# Patient Record
Sex: Female | Born: 1970 | Race: White | Hispanic: No | Marital: Single | Smoking: Never smoker
Health system: Southern US, Community
[De-identification: ages and names within clinical notes are randomized; demographics above are authoritative.]

## PROBLEM LIST (undated history)

## (undated) DIAGNOSIS — A6000 Herpesviral infection of urogenital system, unspecified: Secondary | ICD-10-CM

## (undated) DIAGNOSIS — E041 Nontoxic single thyroid nodule: Secondary | ICD-10-CM

## (undated) DIAGNOSIS — R7303 Prediabetes: Secondary | ICD-10-CM

## (undated) DIAGNOSIS — E785 Hyperlipidemia, unspecified: Secondary | ICD-10-CM

## (undated) HISTORY — DX: Nontoxic single thyroid nodule: E04.1

## (undated) HISTORY — DX: Prediabetes: R73.03

## (undated) HISTORY — DX: Hyperlipidemia, unspecified: E78.5

## (undated) HISTORY — PX: MEDIAL COLLATERAL LIGAMENT REPAIR, KNEE: SHX2019

## (undated) HISTORY — DX: Herpesviral infection of urogenital system, unspecified: A60.00

## (undated) HISTORY — PX: ANTERIOR CRUCIATE LIGAMENT REPAIR: SHX115

---

## 1997-11-27 ENCOUNTER — Other Ambulatory Visit: Admission: RE | Admit: 1997-11-27 | Discharge: 1997-11-27 | Payer: Self-pay | Admitting: Obstetrics and Gynecology

## 1998-01-28 ENCOUNTER — Encounter: Admission: RE | Admit: 1998-01-28 | Discharge: 1998-04-28 | Payer: Self-pay | Admitting: Gynecology

## 1998-02-24 ENCOUNTER — Other Ambulatory Visit: Admission: RE | Admit: 1998-02-24 | Discharge: 1998-02-24 | Payer: Self-pay | Admitting: Obstetrics and Gynecology

## 1998-04-29 ENCOUNTER — Encounter: Admission: RE | Admit: 1998-04-29 | Discharge: 1998-07-28 | Payer: Self-pay | Admitting: Gynecology

## 1998-06-03 ENCOUNTER — Other Ambulatory Visit: Admission: RE | Admit: 1998-06-03 | Discharge: 1998-06-03 | Payer: Self-pay | Admitting: Obstetrics and Gynecology

## 1998-06-24 ENCOUNTER — Other Ambulatory Visit: Admission: RE | Admit: 1998-06-24 | Discharge: 1998-06-24 | Payer: Self-pay | Admitting: Obstetrics and Gynecology

## 1998-12-07 ENCOUNTER — Other Ambulatory Visit: Admission: RE | Admit: 1998-12-07 | Discharge: 1998-12-07 | Payer: Self-pay | Admitting: Obstetrics and Gynecology

## 1999-01-06 ENCOUNTER — Other Ambulatory Visit: Admission: RE | Admit: 1999-01-06 | Discharge: 1999-01-06 | Payer: Self-pay | Admitting: Obstetrics and Gynecology

## 2000-01-04 ENCOUNTER — Other Ambulatory Visit: Admission: RE | Admit: 2000-01-04 | Discharge: 2000-01-04 | Payer: Self-pay | Admitting: Obstetrics and Gynecology

## 2001-02-20 ENCOUNTER — Other Ambulatory Visit: Admission: RE | Admit: 2001-02-20 | Discharge: 2001-02-20 | Payer: Self-pay | Admitting: Obstetrics and Gynecology

## 2005-05-10 ENCOUNTER — Other Ambulatory Visit: Admission: RE | Admit: 2005-05-10 | Discharge: 2005-05-10 | Payer: Self-pay | Admitting: Obstetrics and Gynecology

## 2005-08-22 ENCOUNTER — Other Ambulatory Visit: Admission: RE | Admit: 2005-08-22 | Discharge: 2005-08-22 | Payer: Self-pay | Admitting: Obstetrics and Gynecology

## 2015-12-07 LAB — HM HIV SCREENING LAB: HM HIV Screening: NEGATIVE

## 2016-01-18 DIAGNOSIS — E782 Mixed hyperlipidemia: Secondary | ICD-10-CM | POA: Insufficient documentation

## 2016-02-29 DIAGNOSIS — R7303 Prediabetes: Secondary | ICD-10-CM | POA: Insufficient documentation

## 2016-04-25 HISTORY — PX: THYROIDECTOMY: SHX17

## 2016-08-01 DIAGNOSIS — C73 Malignant neoplasm of thyroid gland: Secondary | ICD-10-CM | POA: Insufficient documentation

## 2016-10-07 DIAGNOSIS — E890 Postprocedural hypothyroidism: Secondary | ICD-10-CM | POA: Insufficient documentation

## 2016-10-19 ENCOUNTER — Other Ambulatory Visit (HOSPITAL_COMMUNITY): Payer: Self-pay | Admitting: Internal Medicine

## 2016-10-19 DIAGNOSIS — C73 Malignant neoplasm of thyroid gland: Secondary | ICD-10-CM

## 2016-11-01 ENCOUNTER — Encounter (HOSPITAL_COMMUNITY)
Admission: RE | Admit: 2016-11-01 | Discharge: 2016-11-01 | Disposition: A | Discharge status: Home / Self Care | Payer: 59 | Source: Ambulatory Visit | Attending: Internal Medicine | Admitting: Internal Medicine

## 2016-11-01 DIAGNOSIS — C73 Malignant neoplasm of thyroid gland: Secondary | ICD-10-CM | POA: Diagnosis not present

## 2016-11-01 MED ORDER — STERILE WATER FOR INJECTION IJ SOLN
INTRAMUSCULAR | Status: AC
  Filled 2016-11-01: qty 10

## 2016-11-01 MED ORDER — THYROTROPIN ALFA 1.1 MG IM SOLR
0.9000 mg | INTRAMUSCULAR | Status: AC
  Administered 2016-11-01: 0.9 mg via INTRAMUSCULAR

## 2016-11-02 ENCOUNTER — Encounter (HOSPITAL_COMMUNITY)
Admission: RE | Admit: 2016-11-02 | Discharge: 2016-11-02 | Disposition: A | Discharge status: Home / Self Care | Payer: 59 | Source: Ambulatory Visit | Attending: Internal Medicine | Admitting: Internal Medicine

## 2016-11-02 DIAGNOSIS — C73 Malignant neoplasm of thyroid gland: Secondary | ICD-10-CM | POA: Diagnosis not present

## 2016-11-02 MED ORDER — THYROTROPIN ALFA 1.1 MG IM SOLR
0.9000 mg | INTRAMUSCULAR | Status: AC
  Administered 2016-11-02: 0.9 mg via INTRAMUSCULAR

## 2016-11-02 MED ORDER — STERILE WATER FOR INJECTION IJ SOLN
INTRAMUSCULAR | Status: AC
  Filled 2016-11-02: qty 10

## 2016-11-02 NOTE — Discharge Instructions (Addendum)
Thyrotropin Alfa injection What is this medicine? THYROTROPIN ALFA (thahy ruh TROH pin AL fa) is a man-made protein. It is used to diagnose any remaining thyroid cancer after treatment. It is also used to help treat thyroid cancer. This medicine may be used for other purposes; ask your health care provider or pharmacist if you have questions. COMMON BRAND NAME(S): Thyrogen What should I tell my health care provider before I take this medicine? They need to know if you have any of these conditions: -cancer that has spread to other parts of the body -have thyroid tissue remaining -heart disease -kidney disease -smoke tobacco -an unusual or allergic reaction to thyrotropin, thyroid products, other hormones, medicines, foods, or preservatives -pregnant or trying to get pregnant -breast-feeding How should I use this medicine? This medicine is for injection into a muscle. It is given by a health care professional in a hospital or clinic setting. Talk to your pediatrician regarding the use of this medicine in children. Special care may be needed. Overdosage: If you think you have taken too much of this medicine contact a poison control center or emergency room at once. NOTE: This medicine is only for you. Do not share this medicine with others. What if I miss a dose? It is important not to miss your dose. Call your doctor or health care professional if you are unable to keep an appointment. What may interact with this medicine? Interactions are not expected. This list may not describe all possible interactions. Give your health care provider a list of all the medicines, herbs, non-prescription drugs, or dietary supplements you use. Also tell them if you smoke, drink alcohol, or use illegal drugs. Some items may interact with your medicine. What should I watch for while using this medicine? Visit your doctor or health care professional regularly. Your condition will be monitored carefully while you  are receiving this medicine. You may need blood work done while you are taking this medicine. What side effects may I notice from receiving this medicine? Side effects that you should report to your doctor or health care professional as soon as possible: -allergic reactions like skin rash, itching or hives, swelling of the face, lips, or tongue -breathing problems -chest pain -signs and symptoms of a stroke like changes in vision; confusion; trouble speaking or understanding; severe headaches; sudden numbness or weakness of the face, arm or leg; trouble walking; dizziness; loss of balance or coordination Side effects that usually do not require medical attention (report to your doctor or health care professional if they continue or are bothersome): -dizziness -flu-like symptoms -headache -nausea, vomiting -weak or tired This list may not describe all possible side effects. Call your doctor for medical advice about side effects. You may report side effects to FDA at 1-800-FDA-1088. Where should I keep my medicine? This drug is given in a hospital or clinic and will not be stored at home. NOTE: This sheet is a summary. It may not cover all possible information. If you have questions about this medicine, talk to your doctor, pharmacist, or health care provider.  2018 Elsevier/Gold Standard (2014-09-05 15:04:47)

## 2016-11-03 ENCOUNTER — Encounter (HOSPITAL_COMMUNITY): Payer: Self-pay | Admitting: Radiology

## 2016-11-03 ENCOUNTER — Encounter (HOSPITAL_COMMUNITY)
Admission: RE | Admit: 2016-11-03 | Discharge: 2016-11-03 | Disposition: A | Discharge status: Home / Self Care | Payer: 59 | Source: Ambulatory Visit | Attending: Internal Medicine | Admitting: Internal Medicine

## 2016-11-03 DIAGNOSIS — C73 Malignant neoplasm of thyroid gland: Secondary | ICD-10-CM | POA: Diagnosis not present

## 2016-11-03 LAB — HCG, SERUM, QUALITATIVE: PREG SERUM: NEGATIVE

## 2016-11-03 MED ORDER — SODIUM IODIDE I 131 CAPSULE
60.7000 | Freq: Once | INTRAVENOUS | Status: AC | PRN
  Administered 2016-11-03: 60.7 via ORAL

## 2016-11-14 ENCOUNTER — Encounter (HOSPITAL_COMMUNITY)
Admission: RE | Admit: 2016-11-14 | Discharge: 2016-11-14 | Disposition: A | Discharge status: Home / Self Care | Payer: 59 | Source: Ambulatory Visit | Attending: Internal Medicine | Admitting: Internal Medicine

## 2016-11-14 DIAGNOSIS — C73 Malignant neoplasm of thyroid gland: Secondary | ICD-10-CM | POA: Insufficient documentation

## 2017-07-24 ENCOUNTER — Other Ambulatory Visit (HOSPITAL_COMMUNITY): Payer: Self-pay | Admitting: Internal Medicine

## 2017-07-24 DIAGNOSIS — C73 Malignant neoplasm of thyroid gland: Secondary | ICD-10-CM

## 2017-07-31 ENCOUNTER — Encounter (HOSPITAL_COMMUNITY)
Admission: RE | Admit: 2017-07-31 | Discharge: 2017-07-31 | Disposition: A | Discharge status: Home / Self Care | Payer: 59 | Source: Ambulatory Visit | Attending: Internal Medicine | Admitting: Internal Medicine

## 2017-07-31 DIAGNOSIS — C73 Malignant neoplasm of thyroid gland: Secondary | ICD-10-CM | POA: Insufficient documentation

## 2017-07-31 MED ORDER — THYROTROPIN ALFA 1.1 MG IM SOLR
0.9000 mg | INTRAMUSCULAR | Status: AC
  Administered 2017-07-31: 0.9 mg via INTRAMUSCULAR

## 2017-07-31 MED ORDER — STERILE WATER FOR INJECTION IJ SOLN
INTRAMUSCULAR | Status: AC
  Filled 2017-07-31: qty 10

## 2017-08-01 ENCOUNTER — Encounter (HOSPITAL_COMMUNITY)
Admission: RE | Admit: 2017-08-01 | Discharge: 2017-08-01 | Disposition: A | Discharge status: Home / Self Care | Payer: 59 | Source: Ambulatory Visit | Attending: Internal Medicine | Admitting: Internal Medicine

## 2017-08-01 DIAGNOSIS — C73 Malignant neoplasm of thyroid gland: Secondary | ICD-10-CM | POA: Diagnosis not present

## 2017-08-01 MED ORDER — STERILE WATER FOR INJECTION IJ SOLN
INTRAMUSCULAR | Status: AC
  Filled 2017-08-01: qty 10

## 2017-08-01 MED ORDER — THYROTROPIN ALFA 1.1 MG IM SOLR
0.9000 mg | INTRAMUSCULAR | Status: AC
  Administered 2017-08-01: 0.9 mg via INTRAMUSCULAR

## 2017-08-02 ENCOUNTER — Encounter (HOSPITAL_COMMUNITY)
Admission: RE | Admit: 2017-08-02 | Discharge: 2017-08-02 | Disposition: A | Discharge status: Home / Self Care | Payer: 59 | Source: Ambulatory Visit | Attending: Internal Medicine | Admitting: Internal Medicine

## 2017-08-02 DIAGNOSIS — C73 Malignant neoplasm of thyroid gland: Secondary | ICD-10-CM | POA: Diagnosis not present

## 2017-08-02 LAB — HCG, SERUM, QUALITATIVE: PREG SERUM: NEGATIVE

## 2017-08-02 MED ORDER — SODIUM IODIDE I 131 CAPSULE
4.0000 | Freq: Once | INTRAVENOUS | Status: AC | PRN
  Administered 2017-08-02: 4 via ORAL

## 2017-08-04 ENCOUNTER — Encounter (HOSPITAL_COMMUNITY)
Admission: RE | Admit: 2017-08-04 | Discharge: 2017-08-04 | Disposition: A | Discharge status: Home / Self Care | Payer: 59 | Source: Ambulatory Visit | Attending: Internal Medicine | Admitting: Internal Medicine

## 2018-08-07 IMAGING — NM NM [ID] THYROID CANCER METS SP CA TX
6 series · 6 of 6 positions shown · non-contrast
Comparison: None.

CLINICAL DATA: Thyroid cancer status post total thyroidectomy.
Remnant ablation 11/04/2016.

EXAM:
NUCLEAR MEDICINE C-SKS POST THERAPY WHOLE BODY SCAN
TECHNIQUE: The patient received 60.7 mCi C-SKS sodium iodide for the treatment
of thyroid cancer within the past 10 days. The patient returns
today, and whole body scanning was performed in the anterior and
posterior projections.

[Series 1: marker · 4.14mm/px · 1 of 1 slices shown (1 of 2)]
[im 1/1]
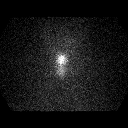

[Series 1: marker · 4.14mm/px · 1 of 1 slices shown (2 of 2)]
[im 1/1]
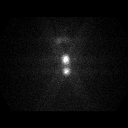

[Series 2: static thyroid no marker · 4.14mm/px · 1 of 1 slices shown (1 of 2)]
[im 1/1]
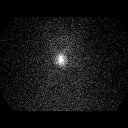

[Series 2: static thyroid no marker · 4.14mm/px · 1 of 1 slices shown (2 of 2)]
[im 1/1]
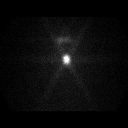

[Series 3: i131 whole body · 2.66mm/px · 1 of 1 slices shown (1 of 2)]
[im 1/1  full-range]
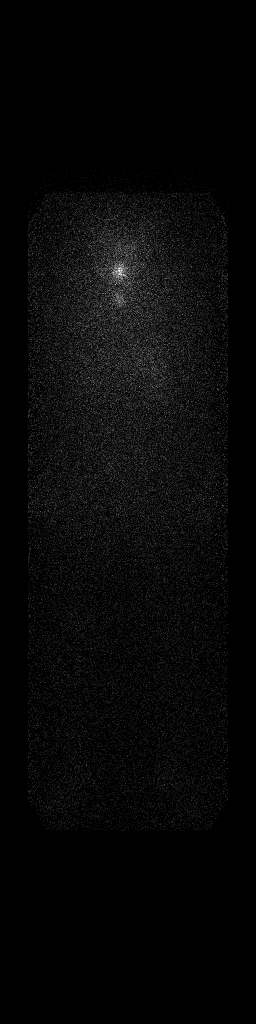

[Series 3: i131 whole body · 2.66mm/px · 1 of 1 slices shown (2 of 2)]
[im 1/1]
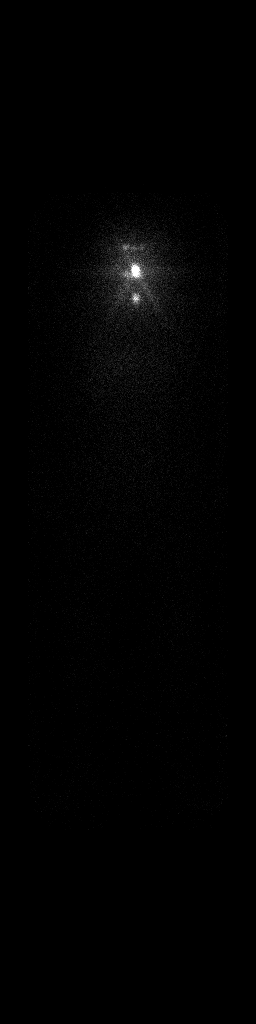

[6 of 6 positions shown; findings below may reference images not displayed]

FINDINGS: There is expected activity within the thyroidectomy bed. There is no
unexpected activity within the neck, chest, abdomen or pelvis.
IMPRESSION: Expected findings status post thyroidectomy and remnant ablation
with activity in the thyroidectomy bed. No evidence of metastatic
disease.

## 2019-01-07 DIAGNOSIS — Z8639 Personal history of other endocrine, nutritional and metabolic disease: Secondary | ICD-10-CM | POA: Insufficient documentation

## 2019-01-07 DIAGNOSIS — J310 Chronic rhinitis: Secondary | ICD-10-CM | POA: Insufficient documentation

## 2019-01-09 DIAGNOSIS — E538 Deficiency of other specified B group vitamins: Secondary | ICD-10-CM | POA: Insufficient documentation

## 2019-01-21 DIAGNOSIS — N951 Menopausal and female climacteric states: Secondary | ICD-10-CM | POA: Insufficient documentation

## 2020-04-25 HISTORY — PX: COLONOSCOPY: SHX174

## 2020-05-18 DIAGNOSIS — Z8639 Personal history of other endocrine, nutritional and metabolic disease: Secondary | ICD-10-CM | POA: Insufficient documentation

## 2021-05-03 DIAGNOSIS — Z9889 Other specified postprocedural states: Secondary | ICD-10-CM | POA: Insufficient documentation

## 2023-02-20 DIAGNOSIS — F5101 Primary insomnia: Secondary | ICD-10-CM | POA: Insufficient documentation

## 2023-04-03 LAB — HM MAMMOGRAPHY

## 2023-07-25 LAB — HM MAMMOGRAPHY

## 2023-09-04 ENCOUNTER — Encounter (HOSPITAL_BASED_OUTPATIENT_CLINIC_OR_DEPARTMENT_OTHER): Payer: Self-pay | Admitting: Student

## 2023-09-04 ENCOUNTER — Ambulatory Visit (HOSPITAL_BASED_OUTPATIENT_CLINIC_OR_DEPARTMENT_OTHER): Admitting: Student

## 2023-09-04 ENCOUNTER — Encounter (HOSPITAL_BASED_OUTPATIENT_CLINIC_OR_DEPARTMENT_OTHER): Payer: Self-pay

## 2023-09-04 ENCOUNTER — Telehealth (HOSPITAL_BASED_OUTPATIENT_CLINIC_OR_DEPARTMENT_OTHER): Payer: Self-pay | Admitting: Student

## 2023-09-04 VITALS — BP 124/78 | HR 62 | Temp 98.2°F | Resp 16 | Ht 63.58 in | Wt 160.2 lb

## 2023-09-04 DIAGNOSIS — R5383 Other fatigue: Secondary | ICD-10-CM | POA: Insufficient documentation

## 2023-09-04 DIAGNOSIS — Z1211 Encounter for screening for malignant neoplasm of colon: Secondary | ICD-10-CM

## 2023-09-04 DIAGNOSIS — E611 Iron deficiency: Secondary | ICD-10-CM | POA: Insufficient documentation

## 2023-09-04 DIAGNOSIS — N951 Menopausal and female climacteric states: Secondary | ICD-10-CM | POA: Diagnosis not present

## 2023-09-04 DIAGNOSIS — Z7689 Persons encountering health services in other specified circumstances: Secondary | ICD-10-CM

## 2023-09-04 DIAGNOSIS — Z9089 Acquired absence of other organs: Secondary | ICD-10-CM | POA: Insufficient documentation

## 2023-09-04 DIAGNOSIS — Z9889 Other specified postprocedural states: Secondary | ICD-10-CM

## 2023-09-04 DIAGNOSIS — E890 Postprocedural hypothyroidism: Secondary | ICD-10-CM

## 2023-09-04 DIAGNOSIS — E538 Deficiency of other specified B group vitamins: Secondary | ICD-10-CM | POA: Diagnosis not present

## 2023-09-04 DIAGNOSIS — E782 Mixed hyperlipidemia: Secondary | ICD-10-CM

## 2023-09-04 MED ORDER — ESTRADIOL 0.1 MG/GM VA CREA: TOPICAL_CREAM | VAGINAL | 0 refills | Status: AC

## 2023-09-04 MED ORDER — ESTRADIOL 0.1 MG/GM VA CREA: 1.0000 | TOPICAL_CREAM | Freq: Every day | VAGINAL | 0 refills | Status: DC

## 2023-09-04 NOTE — Addendum Note (Signed)
 Addended by: Deondrea Aguado on: 09/04/2023 07:45 PM   Modules accepted: Orders

## 2023-09-04 NOTE — Assessment & Plan Note (Signed)
 History of the same, current fatigue- assess with labs.

## 2023-09-04 NOTE — Assessment & Plan Note (Signed)
 Status post-thyroidectomy for precancerous thyroid  cells. Managed with levothyroxine 125 mcg. Fatigue persists, but thyroid  levels are well-managed. Endocrinology follow-up is recommended due to precancerous history. - Continue levothyroxine 125 mcg. - Recommend continued follow-up with endocrinology.

## 2023-09-04 NOTE — Telephone Encounter (Signed)
 Copied from CRM [id number]. Topic: Clinical - Prescription Issue >> Sep 04, 2023 11:43 AM Hassie Lint wrote: Reason for CRM: Brian Campanile from [facility] calling to get clarification on the instructions for estradiol (ESTRACE) 0.1 MG/GM vaginal cream. States the insurance requires specific instructions, like grams per use and is it daily forever or a specific amount of time?  Brian Campanile can be reached at [phone]

## 2023-09-04 NOTE — Assessment & Plan Note (Signed)
 Persistent menopausal symptoms including night sweats and hot flashes, exacerbated by sexual activity. Symptoms have been ongoing for approximately eight years. Current management with progesterone and estradiol cream has reduced daytime symptoms but not nighttime symptoms. - Continue progesterone therapy. - Prescribe estradiol cream to be used once a week.

## 2023-09-04 NOTE — Progress Notes (Signed)
 New Patient Office Visit  Subjective    Patient ID: Carla Alvarez, female    DOB: [birth year]  Age: 53 y.o. MRN: [id number]  CC:  Chief Complaint  Patient presents with   Establish Care    Here to establish care. Needs colonoscopy referral.    Discussed the use of AI scribe software for clinical note transcription with the patient, who gave verbal consent to proceed.  History of Present Illness   Carla Alvarez is a 53 year old female who presents for establishment of care with fatigue and sleep disturbances.  She experiences persistent fatigue and sleep disturbances, feeling tired despite sleeping 7.5 to 8 hours per night. She often wakes multiple times due to night sweats, averaging about 6 hours of actual sleep. These symptoms have been present since her thyroid  surgery and onset of menopause approximately eight years ago. She occasionally uses zolpidem, but it does not significantly improve her sleep quality.  She has a history of thyroid  issues, having undergone thyroidectomy due to precancerous cells. She is currently on levothyroxine 125 mcg daily, increased from 110 mcg. Despite treatment, she experiences persistent fatigue and questions whether her symptoms are related to her thyroid , menopause, or aging. Her thyroid  levels were reported as stable.  She experiences significant menopausal symptoms, including night sweats and hot flashes, persisting for about eight years. These symptoms worsen after sexual activity, lasting about four days before subsiding. She uses progesterone and estradiol cream to manage these symptoms, applying the cream once a week to avoid bleeding, which occurs if used more frequently.  She has a history of iron and B12 deficiency, which may contribute to her fatigue. She is unsure of her current iron status but recalls her previous doctor mentioning her iron levels were not bad. She takes B12 supplements but is uncertain of their efficacy.  She is due for a  colonoscopy, having had a polyp removed three to four years ago that was precancerous. She prefers a closer location for her next colonoscopy due to the effects of anesthesia.  She recently had a mammogram, which was initially inconclusive, requiring a follow-up. The final results were normal. She has no known family medical history due to being adopted, and her biological mother did not provide any information when contacted.  No smoking, lives with boyfriend who is a Naval architect. No history of diabetes or high cholesterol. Uses atorvastatin for cholesterol management.      Reviewed OBGYN  visit from 05/29/23. Screenings:  Colon Cancer: referred. Lung Cancer: no smoking Breast Cancer: done this year. Cervical Cancer: 3-4 years followup Diabetes: indicated check soon - no history  HLD: Has high cholesterol- on lipitor 20mg   The 10-year ASCVD risk score (Arnett DK, et al., 2019) is: 1.6%   Outpatient Encounter Medications as of 09/04/2023  Medication Sig   atorvastatin (LIPITOR) 20 MG tablet Take 20 mg by mouth daily.   cyanocobalamin (VITAMIN B12) 1000 MCG tablet Take 1 tablet by mouth daily.   estradiol (ESTRACE) 1 MG tablet Take 1 mg by mouth daily.   fexofenadine (ALLEGRA) 180 MG tablet Take 180 mg by mouth daily.   levothyroxine (SYNTHROID) 125 MCG tablet Take 125 mcg by mouth daily.   Omega-3 Fatty Acids (KP FISH OIL) 1200 MG CAPS Take 1,200 mg by mouth daily.   progesterone (PROMETRIUM) 100 MG capsule Take 100 mg by mouth at bedtime.   valACYclovir (VALTREX) 1000 MG tablet Take 1,000 mg by mouth daily.   vitamin E 180 MG (400  UNITS) capsule Take 400 Units by mouth daily.   zolpidem (AMBIEN) 10 MG tablet Take 10 mg by mouth at bedtime as needed.   [DISCONTINUED] estradiol (ESTRACE) 0.1 MG/GM vaginal cream Place 1 Applicatorful vaginally daily.   estradiol (ESTRACE) 0.1 MG/GM vaginal cream Place 1 Applicatorful vaginally daily.   No facility-administered encounter medications on  file as of 09/04/2023.    Past Medical History:  Diagnosis Date   Genital herpes    Hyperlipidemia    Pre-diabetes    Thyroid  cyst     Past Surgical History:  Procedure Laterality Date   ANTERIOR CRUCIATE LIGAMENT REPAIR Left    MEDIAL COLLATERAL LIGAMENT REPAIR, KNEE Left    THYROIDECTOMY  2018    Family History  Adopted: Yes    Social History   Socioeconomic History   Marital status: Single    Spouse name: Not on file   Number of children: 0   Years of education: Not on file   Highest education level: Not on file  Occupational History   Not on file  Tobacco Use   Smoking status: Never    Passive exposure: Never   Smokeless tobacco: Never  Vaping Use   Vaping status: Never Used  Substance and Sexual Activity   Alcohol use: Yes    Alcohol/week: 2.0 standard drinks of alcohol    Types: 2 Cans of beer per week   Drug use: Not Currently    Comment: CBD gummies   Sexual activity: Yes  Other Topics Concern   Not on file  Social History Narrative   Not on file   Social Drivers of Health   Financial Resource Strain: Not on file  Food Insecurity: No Food Insecurity (09/04/2023)   Hunger Vital Sign    Worried About Running Out of Food in the Last Year: Never true    Ran Out of Food in the Last Year: Never true  Transportation Needs: Unknown (09/04/2023)   PRAPARE - Administrator, Civil Service (Medical): No    Lack of Transportation (Non-Medical): Not on file  Physical Activity: Not on file  Stress: Not on file  Social Connections: Not on file  Intimate Partner Violence: Not At Risk (09/04/2023)   Humiliation, Afraid, Rape, and Kick questionnaire    Fear of Current or Ex-Partner: No    Emotionally Abused: No    Physically Abused: No    Sexually Abused: No    ROS  Per HPI      Objective    BP 124/78   Pulse 62   Temp 98.2 F (36.8 C) (Oral)   Resp 16   Ht 5' 3.58" (1.615 m)   Wt 160 lb 3.2 oz (72.7 kg)   LMP 04/26/2015  (Approximate)   SpO2 99%   BMI 27.86 kg/m   Physical Exam Constitutional:      General: She is not in acute distress.    Appearance: Normal appearance. She is not ill-appearing.  HENT:     Head: Normocephalic and atraumatic.     Right Ear: External ear normal.     Left Ear: External ear normal.     Nose: Nose normal.     Mouth/Throat:     Mouth: Mucous membranes are moist.     Pharynx: Oropharynx is clear.  Eyes:     General: No scleral icterus.    Extraocular Movements: Extraocular movements intact.     Conjunctiva/sclera: Conjunctivae normal.     Pupils: Pupils are equal, round, and  reactive to light.  Neck:     Vascular: No carotid bruit.     Comments: Scar across inferior neck from prior thyroid  surgery. Cardiovascular:     Rate and Rhythm: Normal rate and regular rhythm.     Pulses: Normal pulses.     Heart sounds: Normal heart sounds. No murmur heard.    No friction rub.  Pulmonary:     Effort: Pulmonary effort is normal. No respiratory distress.     Breath sounds: Normal breath sounds. No wheezing, rhonchi or rales.  Musculoskeletal:        General: Normal range of motion.     Cervical back: Neck supple.     Right lower leg: No edema.     Left lower leg: No edema.  Skin:    General: Skin is warm and dry.     Coloration: Skin is not jaundiced or pale.  Neurological:     General: No focal deficit present.     Mental Status: She is alert.     Deep Tendon Reflexes: Reflexes normal.  Psychiatric:        Mood and Affect: Mood normal.        Behavior: Behavior normal.         Assessment & Plan:   Encounter to establish care  Iron deficiency Assessment & Plan: History of the same, current fatigue- assess with labs.  Orders: -     Iron, TIBC and Ferritin Panel  Vitamin B12 deficiency Assessment & Plan: History of the same, current fatigue- assess with labs.  Orders: -     Vitamin B12  Screen for colon cancer -     Ambulatory referral to  Gastroenterology  Other fatigue Assessment & Plan: Patient notes that she continues to have some fatigue, currently being treated for menopausal symptoms with progesterone and estrogen, being treated for postop hypothyroidism with levothyroxine 125 mcg.  - Discussed that this may be related to sleep, discussed possible sleep study in the future.  - Order vitamin panels to assess levels, history of B12 def and IDA.  Orders: -     Vitamin B12 -     Iron, TIBC and Ferritin Panel -     VITAMIN D 25 Hydroxy (Vit-D Deficiency, Fractures)  Symptomatic menopausal or female climacteric states Assessment & Plan: Persistent menopausal symptoms including night sweats and hot flashes, exacerbated by sexual activity. Symptoms have been ongoing for approximately eight years. Current management with progesterone and estradiol cream has reduced daytime symptoms but not nighttime symptoms. - Continue progesterone therapy. - Prescribe estradiol cream to be used once a week.  Orders: -     Estradiol; Place 1 Applicatorful vaginally daily.  Dispense: 42.5 g; Refill: 0  S/P thyroidectomy  Postoperative hypothyroidism Assessment & Plan: Status post-thyroidectomy for precancerous thyroid  cells. Managed with levothyroxine 125 mcg. Fatigue persists, but thyroid  levels are well-managed. Endocrinology follow-up is recommended due to precancerous history. - Continue levothyroxine 125 mcg. - Recommend continued follow-up with endocrinology.   Mixed hyperlipidemia Assessment & Plan: Stable. Managed with atorvastatin. No new concerns raised during the visit. - Continue atorvastatin therapy.   General Health Maintenance Due for a colonoscopy due to previous polyp removal with precancerous potential. Recent mammogram was normal. Discussed options for colonoscopy providers based on insurance coverage and convenience. - Refer to Doctor Venice Gillis in [place] for colonoscopy. - Discuss lab services available at this  facility for convenience.  Follow-up Follow-up plans discussed for ongoing management and routine screenings. - Schedule follow-up  physical in November. - Coordinate with endocrinology for thyroid  management. - Follow up on lab results for iron and B12 levels.     Return in about 6 months (around 03/06/2024) for Annual Physical.   Lucius Wise T Mckinleigh Schuchart, PA-C

## 2023-09-04 NOTE — Patient Instructions (Addendum)
 It was nice to see you today!  As we discussed in clinic I will let you know how your lab work comes back.  If you have any problems before your next visit feel free to message me via MyChart (minor issues or questions) or call the office, otherwise you may reach out to schedule an office visit.  Thank you! Gerilyn Pilgrim Teala Daffron, PA-C

## 2023-09-04 NOTE — Assessment & Plan Note (Signed)
 Patient notes that she continues to have some fatigue, currently being treated for menopausal symptoms with progesterone and estrogen, being treated for postop hypothyroidism with levothyroxine 125 mcg.  - Discussed that this may be related to sleep, discussed possible sleep study in the future.  - Order vitamin panels to assess levels, history of B12 def and IDA.

## 2023-09-04 NOTE — Assessment & Plan Note (Signed)
 Stable. Managed with atorvastatin. No new concerns raised during the visit. - Continue atorvastatin therapy.

## 2023-09-05 ENCOUNTER — Ambulatory Visit (HOSPITAL_BASED_OUTPATIENT_CLINIC_OR_DEPARTMENT_OTHER): Payer: Self-pay | Admitting: Student

## 2023-09-05 LAB — IRON,TIBC AND FERRITIN PANEL
Ferritin: 74 ng/mL (ref 15–150)
Iron Saturation: 34 % (ref 15–55)
Iron: 117 ug/dL (ref 27–159)
Total Iron Binding Capacity: 349 ug/dL (ref 250–450)
UIBC: 232 ug/dL (ref 131–425)

## 2023-09-05 LAB — VITAMIN D 25 HYDROXY (VIT D DEFICIENCY, FRACTURES): Vit D, 25-Hydroxy: 42.1 ng/mL (ref 30.0–100.0)

## 2023-09-05 LAB — VITAMIN B12: Vitamin B-12: 940 pg/mL (ref 232–1245)

## 2023-09-11 ENCOUNTER — Ambulatory Visit (HOSPITAL_BASED_OUTPATIENT_CLINIC_OR_DEPARTMENT_OTHER): Admitting: Student

## 2023-09-22 ENCOUNTER — Telehealth: Payer: Self-pay | Admitting: Gastroenterology

## 2023-09-22 NOTE — Telephone Encounter (Signed)
 Good afternoon Dr. Cherryl Corona,   DOD PM 5/30  We received a referral for patient to have a colonoscopy. Patient last had a colonoscopy in 2022 with Dr. Randal Bury. Patient is requesting to transfer her care due to Dr. Randal Bury retiring. Patient's previous records are in [employer] for you to review and advise on scheduling.   Thank you.

## 2023-09-28 NOTE — Telephone Encounter (Signed)
 Patient would like to be scheduled for September. Will call back once calendar is available.

## 2023-10-20 NOTE — Telephone Encounter (Signed)
 Patient wants Monday morning for procedure, will call back in end of July.

## 2023-11-22 ENCOUNTER — Encounter: Payer: Self-pay | Admitting: Gastroenterology

## 2023-11-22 NOTE — Telephone Encounter (Signed)
Patient scheduled for procedure and pre visit

## 2024-01-29 ENCOUNTER — Ambulatory Visit

## 2024-01-29 ENCOUNTER — Encounter: Payer: Self-pay | Admitting: Gastroenterology

## 2024-01-29 VITALS — Ht 63.0 in | Wt 160.0 lb

## 2024-01-29 DIAGNOSIS — Z860100 Personal history of colon polyps, unspecified: Secondary | ICD-10-CM

## 2024-01-29 MED ORDER — NA SULFATE-K SULFATE-MG SULF 17.5-3.13-1.6 GM/177ML PO SOLN: 1.0000 | Freq: Once | ORAL | 0 refills | Status: AC

## 2024-01-29 NOTE — Progress Notes (Signed)
 No egg or soy allergy known to patient  No issues known to pt with past sedation with any surgeries or procedures Patient denies ever being told they had issues or difficulty with intubation  No FH of Malignant Hyperthermia Pt is not on diet pills Pt is not on  home 02  Pt is not on blood thinners  Constipation on occasion  No A fib or A flutter Have any cardiac testing pending-- no  LOA: independent  Prep: suprep   Patient's chart reviewed by Rogena Class CNRA prior to previsit and patient appropriate for the [facility].  Previsit completed and red dot placed by patient's name on their procedure day (on provider's schedule).     PV completed with patient. Prep instructions sent via mychart and home address.

## 2024-02-12 ENCOUNTER — Ambulatory Visit: Admitting: Gastroenterology

## 2024-02-12 ENCOUNTER — Encounter: Payer: Self-pay | Admitting: Gastroenterology

## 2024-02-12 VITALS — BP 127/82 | HR 69 | Temp 98.0°F | Resp 14 | Ht 63.0 in | Wt 160.0 lb

## 2024-02-12 DIAGNOSIS — K644 Residual hemorrhoidal skin tags: Secondary | ICD-10-CM

## 2024-02-12 DIAGNOSIS — Z860101 Personal history of adenomatous and serrated colon polyps: Secondary | ICD-10-CM

## 2024-02-12 DIAGNOSIS — Z1211 Encounter for screening for malignant neoplasm of colon: Secondary | ICD-10-CM | POA: Diagnosis present

## 2024-02-12 DIAGNOSIS — K6289 Other specified diseases of anus and rectum: Secondary | ICD-10-CM | POA: Diagnosis not present

## 2024-02-12 DIAGNOSIS — D123 Benign neoplasm of transverse colon: Secondary | ICD-10-CM

## 2024-02-12 DIAGNOSIS — Z860100 Personal history of colon polyps, unspecified: Secondary | ICD-10-CM

## 2024-02-12 DIAGNOSIS — K5730 Diverticulosis of large intestine without perforation or abscess without bleeding: Secondary | ICD-10-CM | POA: Diagnosis not present

## 2024-02-12 HISTORY — PX: COLONOSCOPY: SHX174

## 2024-02-12 MED ORDER — SODIUM CHLORIDE 0.9 % IV SOLN: 500.0000 mL | Freq: Once | INTRAVENOUS | Status: DC

## 2024-02-12 NOTE — Op Note (Signed)
 [facility] Patient Name: Carla Alvarez Procedure Date: 02/12/2024 9:44 AM MRN: [id number] Endoscopist: Glendia E. Stacia , MD, [id number] Age: 53 Referring MD:  Date of Birth: 1970/11/11 Gender: Female Account #: [id number] Procedure:                Colonoscopy Indications:              High risk colon cancer surveillance: Personal                            history of adenoma (10 mm or greater in size; 12 mm                            adenoma removed in July 2022) Medicines:                Monitored Anesthesia Care Procedure:                Pre-Anesthesia Assessment:                           - Prior to the procedure, a History and Physical                            was performed, and patient medications and                            allergies were reviewed. The patient's tolerance of                            previous anesthesia was also reviewed. The risks                            and benefits of the procedure and the sedation                            options and risks were discussed with the patient.                            All questions were answered, and informed consent                            was obtained. Prior Anticoagulants: The patient has                            taken no anticoagulant or antiplatelet agents. ASA                            Grade Assessment: II - A patient with mild systemic                            disease. After reviewing the risks and benefits,                            the patient was deemed in satisfactory condition to  undergo the procedure.                           After obtaining informed consent, the colonoscope                            was passed under direct vision. Throughout the                            procedure, the patient's blood pressure, pulse, and                            oxygen saturations were monitored continuously. The                            CF [id number] #[id number] was  introduced through the anus                            and advanced to the the terminal ileum, with                            identification of the appendiceal orifice and IC                            valve. The colonoscopy was performed without                            difficulty. The patient tolerated the procedure                            well. The quality of the bowel preparation was                            good. The terminal ileum, ileocecal valve,                            appendiceal orifice, and rectum were photographed.                            The bowel preparation used was SUPREP via split                            dose instruction. Scope In: 10:10:16 AM Scope Out: 10:23:29 AM Scope Withdrawal Time: 0 hours 8 minutes 6 seconds  Total Procedure Duration: 0 hours 13 minutes 13 seconds  Findings:                 Skin tags were found on perianal exam.                           The digital rectal exam was normal. Pertinent                            negatives include normal sphincter tone and no  palpable rectal lesions.                           A 4 mm polyp was found in the proximal transverse                            colon. The polyp was sessile. The polyp was removed                            with a cold snare. Resection and retrieval were                            complete. Estimated blood loss was minimal.                           Many medium-mouthed and small-mouthed diverticula                            were found in the sigmoid colon and descending                            colon.                           The exam was otherwise normal throughout the                            examined colon.                           The terminal ileum appeared normal.                           Anal papilla(e) were hypertrophied.                           No additional abnormalities were found on                             retroflexion. Complications:            No immediate complications. Estimated Blood Loss:     Estimated blood loss was minimal. Impression:               - Perianal skin tags found on perianal exam.                           - One 4 mm polyp in the proximal transverse colon,                            removed with a cold snare. Resected and retrieved.                           - Moderate diverticulosis in the sigmoid colon and  in the descending colon.                           - The examined portion of the ileum was normal.                           - Anal papilla(e) were hypertrophied. Recommendation:           - Patient has a contact number available for                            emergencies. The signs and symptoms of potential                            delayed complications were discussed with the                            patient. Return to normal activities tomorrow.                            Written discharge instructions were provided to the                            patient.                           - Resume previous diet.                           - Continue present medications.                           - Await pathology results.                           - Repeat colonoscopy in 5 years for surveillance. Dwanda Tufano E. Stacia, MD 02/12/2024 10:27:57 AM This report has been signed electronically.

## 2024-02-12 NOTE — Progress Notes (Signed)
 Pt's states no medical or surgical changes since previsit or office visit.

## 2024-02-12 NOTE — Progress Notes (Signed)
 Called to room to assist during endoscopic procedure.  Patient ID and intended procedure confirmed with present staff. Received instructions for my participation in the procedure from the performing physician.

## 2024-02-12 NOTE — Progress Notes (Signed)
 To pacu, VSS. Report to [email or web address].tb

## 2024-02-12 NOTE — Progress Notes (Signed)
 [facility] History and Physical   Primary Care Physician:  Rothfuss, Lang DASEN, PA-C   Reason for Procedure:   Colon cancer screening/history of polyps  Plan:    Surveillance colonoscopy     HPI: Carla Alvarez is a 53 y.o. female undergoing surveillance colonoscopy.  She has no family history of colon cancer and no chronic GI symptoms.  She had a colonoscopy in July 2022 with Dr. Towana and a 12 mm adenoma was removed.   Past Medical History:  Diagnosis Date   Genital herpes    Hyperlipidemia    Pre-diabetes    Thyroid  cyst     Past Surgical History:  Procedure Laterality Date   ANTERIOR CRUCIATE LIGAMENT REPAIR Left    COLONOSCOPY  2022   Dr Towana [place]   MEDIAL COLLATERAL LIGAMENT REPAIR, KNEE Left    THYROIDECTOMY  2018    Prior to Admission medications   Medication Sig Start Date End Date Taking? Authorizing Provider  acetaminophen (TYLENOL) 325 MG tablet Take 650 mg by mouth every 6 (six) hours as needed.   Yes [provider]  atorvastatin (LIPITOR) 20 MG tablet Take 20 mg by mouth daily.   Yes [provider]  cyanocobalamin (VITAMIN B12) 1000 MCG tablet Take 1 tablet by mouth daily. 01/21/19  Yes [provider]  estradiol  (ESTRACE ) 1 MG tablet Take 1 mg by mouth daily.   Yes [provider]  fexofenadine (ALLEGRA) 180 MG tablet Take 180 mg by mouth daily. 01/16/17  Yes [provider]  levothyroxine (SYNTHROID) 112 MCG tablet Take 112 mcg by mouth daily.   Yes [provider]  naproxen sodium (ALEVE) 220 MG tablet Take 220 mg by mouth.   Yes [provider]  Omega-3 Fatty Acids (KP FISH OIL) 1200 MG CAPS Take 1,200 mg by mouth daily. 12/03/15  Yes [provider]  progesterone (PROMETRIUM) 100 MG capsule Take 100 mg by mouth at bedtime.   Yes [provider]  vitamin E 180 MG (400 UNITS) capsule Take 400 Units by mouth daily.   Yes [provider]  estradiol   (ESTRACE ) 0.1 MG/GM vaginal cream Apply 0.5 to 1 gram directly to external genitalia once weekly until the tube is finished. 09/04/23   Rothfuss, Jacob T, PA-C  valACYclovir (VALTREX) 1000 MG tablet Take 1,000 mg by mouth daily.    [provider]  zolpidem (AMBIEN) 10 MG tablet Take 10 mg by mouth at bedtime as needed.    [provider]    Current Outpatient Medications  Medication Sig Dispense Refill   acetaminophen (TYLENOL) 325 MG tablet Take 650 mg by mouth every 6 (six) hours as needed.     atorvastatin (LIPITOR) 20 MG tablet Take 20 mg by mouth daily.     cyanocobalamin (VITAMIN B12) 1000 MCG tablet Take 1 tablet by mouth daily.     estradiol  (ESTRACE ) 1 MG tablet Take 1 mg by mouth daily.     fexofenadine (ALLEGRA) 180 MG tablet Take 180 mg by mouth daily.     levothyroxine (SYNTHROID) 112 MCG tablet Take 112 mcg by mouth daily.     naproxen sodium (ALEVE) 220 MG tablet Take 220 mg by mouth.     Omega-3 Fatty Acids (KP FISH OIL) 1200 MG CAPS Take 1,200 mg by mouth daily.     progesterone (PROMETRIUM) 100 MG capsule Take 100 mg by mouth at bedtime.     vitamin E 180 MG (400 UNITS) capsule Take 400 Units by  mouth daily.     estradiol  (ESTRACE ) 0.1 MG/GM vaginal cream Apply 0.5 to 1 gram directly to external genitalia once weekly until the tube is finished. 42.5 g 0   valACYclovir (VALTREX) 1000 MG tablet Take 1,000 mg by mouth daily.     zolpidem (AMBIEN) 10 MG tablet Take 10 mg by mouth at bedtime as needed.     Current Facility-Administered Medications  Medication Dose Route Frequency Provider Last Rate Last Admin   0.9 %  sodium chloride infusion  500 mL Intravenous Once Stacia Glendia BRAVO, MD        Allergies as of 02/12/2024 - Review Complete 02/12/2024  Allergen Reaction Noted   Bee venom Anaphylaxis 12/19/2016   Wasp venom Anaphylaxis 11/01/2016    Family History  Adopted: Yes    Social History   Socioeconomic History   Marital status: Single     Spouse name: Not on file   Number of children: 0   Years of education: Not on file   Highest education level: Not on file  Occupational History   Not on file  Tobacco Use   Smoking status: Never    Passive exposure: Never   Smokeless tobacco: Never  Vaping Use   Vaping status: Never Used  Substance and Sexual Activity   Alcohol use: Yes    Alcohol/week: 2.0 standard drinks of alcohol    Types: 2 Cans of beer per week   Drug use: Not Currently    Comment: CBD gummies   Sexual activity: Yes    Birth control/protection: Post-menopausal  Other Topics Concern   Not on file  Social History Narrative   Not on file   Social Drivers of Health   Financial Resource Strain: Not on file  Food Insecurity: No Food Insecurity (09/04/2023)   Hunger Vital Sign    Worried About Running Out of Food in the Last Year: Never true    Ran Out of Food in the Last Year: Never true  Transportation Needs: Unknown (09/04/2023)   PRAPARE - Administrator, Civil Service (Medical): No    Lack of Transportation (Non-Medical): Not on file  Physical Activity: Not on file  Stress: Not on file  Social Connections: Not on file  Intimate Partner Violence: Not At Risk (09/04/2023)   Humiliation, Afraid, Rape, and Kick questionnaire    Fear of Current or Ex-Partner: No    Emotionally Abused: No    Physically Abused: No    Sexually Abused: No    Review of Systems:  All other review of systems negative except as mentioned in the HPI.  Physical Exam: Vital signs BP 116/75   Pulse 62   Temp 98 F (36.7 C) (Temporal)   Resp 10   Ht 5' 3 (1.6 m)   Wt 160 lb (72.6 kg)   LMP 04/26/2015 (Approximate)   SpO2 100%   BMI 28.34 kg/m   General:   Alert,  Well-developed, well-nourished, pleasant and cooperative in NAD Airway:  Mallampati 2 Lungs:  Clear throughout to auscultation.   Heart:  Regular rate and rhythm; no murmurs, clicks, rubs,  or gallops. Abdomen:  Soft, nontender and  nondistended. Normal bowel sounds.   Neuro/Psych:  Normal mood and affect. A and O x 3   Keisi Eckford E. Stacia, MD [facility]

## 2024-02-12 NOTE — Patient Instructions (Addendum)
 Resume previous diet Continue present medications Await pathology results Repeat colonoscopy in 5 years for surveillance See handouts on diverticulosis and polyps YOU HAD AN ENDOSCOPIC PROCEDURE TODAY AT THE [facility]:   Refer to the procedure report that was given to you for any specific questions about what was found during the examination.  If the procedure report does not answer your questions, please call your gastroenterologist to clarify.  If you requested that your care partner not be given the details of your procedure findings, then the procedure report has been included in a sealed envelope for you to review at your convenience later.  YOU SHOULD EXPECT: Some feelings of bloating in the abdomen. Passage of more gas than usual.  Walking can help get rid of the air that was put into your GI tract during the procedure and reduce the bloating. If you had a lower endoscopy (such as a colonoscopy or flexible sigmoidoscopy) you may notice spotting of blood in your stool or on the toilet paper. If you underwent a bowel prep for your procedure, you may not have a normal bowel movement for a few days.  Please Note:  You might notice some irritation and congestion in your nose or some drainage.  This is from the oxygen used during your procedure.  There is no need for concern and it should clear up in a day or so.  SYMPTOMS TO REPORT IMMEDIATELY:  Following lower endoscopy (colonoscopy or flexible sigmoidoscopy):  Excessive amounts of blood in the stool  Significant tenderness or worsening of abdominal pains  Swelling of the abdomen that is new, acute  Fever of 100F or higher  For urgent or emergent issues, a gastroenterologist can be reached at any hour by calling [phone]. Do not use MyChart messaging for urgent concerns.   DIET:  We do recommend a small meal at first, but then you may proceed to your regular diet.  Drink plenty of fluids but you should avoid alcoholic  beverages for 24 hours.  ACTIVITY:  You should plan to take it easy for the rest of today and you should NOT DRIVE or use heavy machinery until tomorrow (because of the sedation medicines used during the test).    FOLLOW UP: Our staff will call the number listed on your records the next business day following your procedure.  We will call around 7:15- 8:00 am to check on you and address any questions or concerns that you may have regarding the information given to you following your procedure. If we do not reach you, we will leave a message.     If any biopsies were taken you will be contacted by phone or by letter within the next 1-3 weeks.  Please call us  at [phone] if you have not heard about the biopsies in 3 weeks.   SIGNATURES/CONFIDENTIALITY: You and/or your care partner have signed paperwork which will be entered into your electronic medical record.  These signatures attest to the fact that that the information above on your After Visit Summary has been reviewed and is understood.  Full responsibility of the confidentiality of this discharge information lies with you and/or your care-partner.

## 2024-02-13 ENCOUNTER — Telehealth: Payer: Self-pay

## 2024-02-13 NOTE — Telephone Encounter (Signed)
  Follow up Call-     02/12/2024    8:18 AM  Call back number  Post procedure Call Back phone  # [phone]  Permission to leave phone message Yes     Patient questions:  Do you have a fever, pain , or abdominal swelling? Yes.   Pain Score  1 *  Have you tolerated food without any problems? Yes.    Have you been able to return to your normal activities? Yes.    Do you have any questions about your discharge instructions: Diet   No. Medications  No. Follow up visit  No.  Do you have questions or concerns about your Care? No.  Actions: * If pain score is 4 or above: No action needed, pain <4.

## 2024-02-14 LAB — SURGICAL PATHOLOGY

## 2024-02-16 ENCOUNTER — Ambulatory Visit: Payer: Self-pay | Admitting: Gastroenterology

## 2024-02-16 NOTE — Progress Notes (Signed)
 Carla Alvarez,  One polyp which I removed during your recent procedure was proven to be completely benign but is considered a pre-cancerous polyp that MAY have grown into cancer if it had not been removed.  Studies shows that at least 20% of women over age 55 and 30% of men over age 24 have pre-cancerous polyps.  Based on your history of a 12 mm tubular adenoma in 2022, I recommend that you have a repeat colonoscopy in 5 years.   If you develop any new rectal bleeding, abdominal pain or significant bowel habit changes, please contact me before then.

## 2024-04-01 ENCOUNTER — Encounter (HOSPITAL_BASED_OUTPATIENT_CLINIC_OR_DEPARTMENT_OTHER): Payer: Self-pay | Admitting: Student

## 2024-04-01 ENCOUNTER — Ambulatory Visit (HOSPITAL_BASED_OUTPATIENT_CLINIC_OR_DEPARTMENT_OTHER): Admitting: Student

## 2024-04-01 ENCOUNTER — Ambulatory Visit (INDEPENDENT_AMBULATORY_CARE_PROVIDER_SITE_OTHER)
Admission: RE | Admit: 2024-04-01 | Discharge: 2024-04-01 | Disposition: A | Source: Ambulatory Visit | Attending: Student | Admitting: Radiology

## 2024-04-01 VITALS — BP 112/77 | HR 73 | Temp 98.3°F | Resp 16 | Ht 63.0 in | Wt 163.0 lb

## 2024-04-01 DIAGNOSIS — R5383 Other fatigue: Secondary | ICD-10-CM

## 2024-04-01 DIAGNOSIS — Z131 Encounter for screening for diabetes mellitus: Secondary | ICD-10-CM

## 2024-04-01 DIAGNOSIS — G8929 Other chronic pain: Secondary | ICD-10-CM

## 2024-04-01 DIAGNOSIS — E782 Mixed hyperlipidemia: Secondary | ICD-10-CM

## 2024-04-01 DIAGNOSIS — N951 Menopausal and female climacteric states: Secondary | ICD-10-CM

## 2024-04-01 DIAGNOSIS — M25562 Pain in left knee: Secondary | ICD-10-CM

## 2024-04-01 DIAGNOSIS — Z9089 Acquired absence of other organs: Secondary | ICD-10-CM

## 2024-04-01 DIAGNOSIS — E890 Postprocedural hypothyroidism: Secondary | ICD-10-CM | POA: Diagnosis not present

## 2024-04-01 DIAGNOSIS — Z9889 Other specified postprocedural states: Secondary | ICD-10-CM

## 2024-04-01 LAB — COMPREHENSIVE METABOLIC PANEL WITH GFR
ALT: 22 IU/L (ref 0–32)
AST: 14 IU/L (ref 0–40)
Albumin: 4.2 g/dL (ref 3.8–4.9)
Alkaline Phosphatase: 30 IU/L — ABNORMAL LOW (ref 49–135)
BUN/Creatinine Ratio: 13 (ref 9–23)
BUN: 11 mg/dL (ref 6–24)
Bilirubin Total: <0.2 mg/dL (ref 0.0–1.2)
CO2: 25 mmol/L (ref 20–29)
Calcium: 9.2 mg/dL (ref 8.7–10.2)
Chloride: 100 mmol/L (ref 96–106)
Creatinine, Ser: 0.86 mg/dL (ref 0.57–1.00)
Globulin, Total: 1.7 g/dL (ref 1.5–4.5)
Glucose: 122 mg/dL — ABNORMAL HIGH (ref 70–99)
Potassium: 4.3 mmol/L (ref 3.5–5.2)
Sodium: 137 mmol/L (ref 134–144)
Total Protein: 5.9 g/dL — ABNORMAL LOW (ref 6.0–8.5)
eGFR: 80 mL/min/1.73 (ref >59)

## 2024-04-01 LAB — HEMOGLOBIN A1C
Est. average glucose Bld gHb Est-mCnc: 123 mg/dL
Hgb A1c MFr Bld: 5.9 % — ABNORMAL HIGH (ref 4.8–5.6)

## 2024-04-01 LAB — LIPID PANEL
Chol/HDL Ratio: 3.7 ratio (ref 0.0–4.4)
Cholesterol, Total: 173 mg/dL (ref 100–199)
HDL: 47 mg/dL (ref >39)
LDL Chol Calc (NIH): 95 mg/dL (ref 0–99)
Triglycerides: 181 mg/dL — ABNORMAL HIGH (ref 0–149)
VLDL Cholesterol Cal: 31 mg/dL (ref 5–40)

## 2024-04-01 LAB — CBC WITH DIFFERENTIAL/PLATELET
Basophils Absolute: 0 x10E3/uL (ref 0.0–0.2)
Basos: 0 %
EOS (ABSOLUTE): 0.1 x10E3/uL (ref 0.0–0.4)
Eos: 1 %
Hematocrit: 40.6 % (ref 34.0–46.6)
Hemoglobin: 13.5 g/dL (ref 11.1–15.9)
Immature Grans (Abs): 0 x10E3/uL (ref 0.0–0.1)
Immature Granulocytes: 0 %
Lymphocytes Absolute: 1.3 x10E3/uL (ref 0.7–3.1)
Lymphs: 29 %
MCH: 30.2 pg (ref 26.6–33.0)
MCHC: 33.3 g/dL (ref 31.5–35.7)
MCV: 91 fL (ref 79–97)
Monocytes Absolute: 0.3 x10E3/uL (ref 0.1–0.9)
Monocytes: 6 %
Neutrophils Absolute: 2.9 x10E3/uL (ref 1.4–7.0)
Neutrophils: 64 %
Platelets: 274 x10E3/uL (ref 150–450)
RBC: 4.47 x10E6/uL (ref 3.77–5.28)
RDW: 13 % (ref 11.7–15.4)
WBC: 4.5 x10E3/uL (ref 3.4–10.8)

## 2024-04-01 LAB — TSH+FREE T4
Free T4: 1.52 ng/dL (ref 0.82–1.77)
TSH: 0.35 u[IU]/mL — ABNORMAL LOW (ref 0.450–4.500)

## 2024-04-01 MED ORDER — MELOXICAM 15 MG PO TABS: 15.0000 mg | ORAL_TABLET | Freq: Every day | ORAL | 0 refills | Status: DC

## 2024-04-01 MED ORDER — PROGESTERONE MICRONIZED 100 MG PO CAPS: 100.0000 mg | ORAL_CAPSULE | Freq: Every day | ORAL | 6 refills | Status: AC

## 2024-04-01 MED ORDER — ATORVASTATIN CALCIUM 20 MG PO TABS: 20.0000 mg | ORAL_TABLET | Freq: Every day | ORAL | 3 refills | Status: AC

## 2024-04-01 NOTE — Progress Notes (Signed)
 Established Patient Office Visit  Subjective   Patient ID: Carla Alvarez, female    DOB: 30-Aug-1970  Age: 53 y.o. MRN: [id number]  Chief Complaint  Patient presents with   Medical Management of Chronic Issues    Follow up. Needs refills.    Knee    Needs to discuss left knee swelling.     HPI  Discussed the use of AI scribe software for clinical note transcription with the patient, who gave verbal consent to proceed.  History of Present Illness   Persephonie Hegwood is a 53 year old female who presents for follow-up on her thyroid  levels and management of knee pain.  She is on levothyroxine 112 mcg daily following thyroidectomy. Recent blood work shows stable thyroid  levels. She experiences persistent fatigue and low energy.  She has left knee pain for about a year, described as mild but noticeable, especially when bending. The pain is rated as a three out of ten, with occasional swelling, popping, and laxity. She has a history of ACL and MCL surgery on the knee approximately 20 years ago and is concerned about fluid buildup and possible arthritis.  She reports hip pain, particularly at night, requiring frequent position changes. She attributes this to hormonal changes associated with menopause. No tingling or radiation of pain down her legs.  She takes atorvastatin  20 mg for cholesterol management and is transitioning her care to a new facility. She is also on estradiol  and progesterone , with a sufficient supply of estradiol  but needing a refill for progesterone . She takes both medications daily and is concerned about hormone replacement therapy due to her history of thyroid  cancer. Her mother, a engineer, civil (consulting), shares this concern.  She works as a naval architect, which involves long periods of sitting, and reports difficulty sleeping, especially in a new truck with a generator that turns on and off. She occasionally uses sleeping pills.      Patient Active Problem List   Diagnosis Date Noted    Other fatigue 09/04/2023   S/P thyroidectomy 09/04/2023   Iron deficiency 09/04/2023   Primary insomnia 02/20/2023   History of hysteroscopy 05/03/2021   History of primary hyperparathyroidism 05/18/2020   Symptomatic menopausal or female climacteric states 01/21/2019   Vitamin B12 deficiency 01/09/2019   Chronic rhinitis 01/07/2019   History of iron deficiency 01/07/2019   Postoperative hypothyroidism 10/07/2016   Thyroid  cancer (HCC) 08/01/2016   Prediabetes 02/29/2016   Mixed hyperlipidemia 01/18/2016   Past Medical History:  Diagnosis Date   Genital herpes    Hyperlipidemia    Pre-diabetes    Thyroid  cyst    Social History   Tobacco Use   Smoking status: Never    Passive exposure: Never   Smokeless tobacco: Never  Vaping Use   Vaping status: Never Used  Substance Use Topics   Alcohol use: Not Currently    Comment: 2 beers at night when at home   Drug use: Not Currently    Comment: CBD gummies in past   Allergies  Allergen Reactions   Bee Venom Anaphylaxis   Wasp Venom Anaphylaxis      ROS Per HPI.    Objective:     BP 112/77   Pulse 73   Temp 98.3 F (36.8 C) (Oral)   Resp 16   Ht 5' 3 (1.6 m)   Wt 163 lb (73.9 kg)   LMP 04/26/2015 (Approximate)   SpO2 98%   BMI 28.87 kg/m  BP Readings from Last 3 Encounters:  04/01/24  112/77  02/12/24 127/82  09/04/23 124/78   Wt Readings from Last 3 Encounters:  04/01/24 163 lb (73.9 kg)  02/12/24 160 lb (72.6 kg)  01/29/24 160 lb (72.6 kg)   SpO2 Readings from Last 3 Encounters:  04/01/24 98%  02/12/24 97%  09/04/23 99%      Physical Exam Constitutional:      General: She is not in acute distress.    Appearance: Normal appearance. She is not ill-appearing.  HENT:     Head: Normocephalic and atraumatic.     Nose: Nose normal.  Eyes:     General: No scleral icterus.    Conjunctiva/sclera: Conjunctivae normal.  Cardiovascular:     Rate and Rhythm: Normal rate and regular rhythm.     Heart  sounds: Normal heart sounds. No murmur heard.    No friction rub.  Pulmonary:     Effort: Pulmonary effort is normal. No respiratory distress.     Breath sounds: Normal breath sounds. No wheezing, rhonchi or rales.  Musculoskeletal:        General: Normal range of motion.     Comments: Knee: Normal to inspection with no erythema or obvious bony abnormalities. Appears to have some swelling on left knee. Palpation normal with no warmth, joint line tenderness, patellar tenderness, or condyle tenderness. Generalized tenderness to anterior knee. ROM full in flexion and extension and lower leg rotation. Ligaments with solid consistent endpoints including ACL, PCL, LCL, MCL. EXCEPT: left knee MCL with laxity to varus stress without pain- history of surgery on this MCL. Negative Thessaly tests. Mild pain with patellar compression. Patellar glide without major crepitus. Patellar and quadriceps tendons unremarkable.  Hip: Pain to palpation of greater trochanter bilateral  Normal windshield wiper test  Skin:    General: Skin is warm and dry.     Coloration: Skin is not jaundiced or pale.  Neurological:     General: No focal deficit present.     Mental Status: She is alert.  Psychiatric:        Mood and Affect: Mood normal.        Behavior: Behavior normal.    Results for orders placed or performed in visit on 04/01/24  [facility] HIV SCREENING LAB  Result Value Ref Range   [facility] HIV Screening Negative - Validated     The 10-year ASCVD risk score (Arnett DK, et al., 2019) is: 1.5%    Assessment & Plan:   Assessment and Plan    Chronic left knee pain and osteoarthritis Chronic left knee pain with swelling and laxity, likely due to osteoarthritis. Previous ACL and MCL surgery 20 years ago- continues to have varus laxity. Pain exacerbated by certain movements and positions, particularly when lying on the side. No acute injury reported. - Ordered x-ray of left knee with weight-bearing AP and  merchant view to assess for arthritis. - Prescribed meloxicam  for two weeks to address arthritis and inflammation. - Advised against taking ibuprofen or Aleve while on meloxicam . - Recommended icing the knee for 15 minutes on and 15 minutes off. - Discussed potential for physical therapy to strengthen the knee with current varus laxity without pain, but she is hesitant due to work constraints. - Will consider knee injection if meloxicam  is ineffective.  Menopausal symptoms Including hip pain, likely due to bursitis or arthritis rather than hormonal changes. Estradiol  and progesterone  levels managed by OB GYN. Concerns about hormone replacement therapy due to thyroid  cancer history, but current data suggests it is safer than  previously thought. - Refilled progesterone  prescription. - Advised to discuss hormone management with OB GYN. - Consider trial of NSAID for hip pain.  Postprocedural hypothyroidism Managed with levothyroxine 112 mcg. Recent thyroid  levels indicate well-managed condition. No current concerns regarding thyroid  cancer recurrence. - Continue levothyroxine 112 mcg daily. - Rechecked thyroid  levels as part of baseline labs.  Mixed hyperlipidemia Managed with atorvastatin  20 mg. No recent follow-up with cholesterol specialist due to transition of care. - Refilled atorvastatin  20 mg prescription.  General health maintenance Routine health maintenance discussed, including mammogram and colonoscopy follow-up. Last mammogram in April 2025. Colonoscopy follow-up in 2030 due to precancerous polyp removal. - Scheduled physical exam in January. - Ordered baseline labs today. - Plan for next colonoscopy in 2030.       Return in about 4 weeks (around 04/29/2024) for Annual Physical.    Charne Mcbrien T Firman Petrow, PA-C

## 2024-04-01 NOTE — Patient Instructions (Addendum)
 It was nice to see you today!  I would like for you to do some physical therapy for your left knee, please let me know if you decide to do this.  I would like to get an x-ray of your left knee to assess for any arthritis.  Please make sure you get this done today before you leave.  I have given you a medication called meloxicam  to address the inflammation that is likely in your knee and hips, please do not take this with ibuprofen or Aleve and make sure you take it in the morning with food.  I would advise you to take this for 2 weeks and then stop.  I will see you in about 4 weeks for your annual physical, we are getting labs done today and I will let you know how that go.  Anything that we did not discuss, we can discuss at your upcoming physical.  If you have any problems before your next visit feel free to message me via MyChart (minor issues or questions) or call the office, otherwise you may reach out to schedule an office visit.  Thank you! Loriana Samad, PA-C

## 2024-04-02 ENCOUNTER — Ambulatory Visit (HOSPITAL_BASED_OUTPATIENT_CLINIC_OR_DEPARTMENT_OTHER): Payer: Self-pay | Admitting: Student

## 2024-04-04 ENCOUNTER — Encounter (HOSPITAL_BASED_OUTPATIENT_CLINIC_OR_DEPARTMENT_OTHER): Payer: Self-pay

## 2024-04-10 ENCOUNTER — Other Ambulatory Visit (HOSPITAL_BASED_OUTPATIENT_CLINIC_OR_DEPARTMENT_OTHER): Payer: Self-pay | Admitting: Student

## 2024-04-10 DIAGNOSIS — G8929 Other chronic pain: Secondary | ICD-10-CM

## 2024-05-06 ENCOUNTER — Encounter: Payer: Self-pay | Admitting: Sports Medicine

## 2024-05-06 ENCOUNTER — Ambulatory Visit: Admitting: Sports Medicine

## 2024-05-06 ENCOUNTER — Other Ambulatory Visit: Payer: Self-pay

## 2024-05-06 DIAGNOSIS — M1712 Unilateral primary osteoarthritis, left knee: Secondary | ICD-10-CM

## 2024-05-06 DIAGNOSIS — G8929 Other chronic pain: Secondary | ICD-10-CM

## 2024-05-06 DIAGNOSIS — M25562 Pain in left knee: Secondary | ICD-10-CM | POA: Diagnosis not present

## 2024-05-06 DIAGNOSIS — M25462 Effusion, left knee: Secondary | ICD-10-CM

## 2024-05-06 MED ORDER — METHYLPREDNISOLONE ACETATE 40 MG/ML IJ SUSP
80.0000 mg | INTRAMUSCULAR | Status: AC | PRN
  Administered 2024-05-06: 80 mg via INTRA_ARTICULAR

## 2024-05-06 MED ORDER — LIDOCAINE HCL 1 % IJ SOLN
4.0000 mL | INTRAMUSCULAR | Status: AC | PRN
  Administered 2024-05-06: 4 mL

## 2024-05-06 MED ORDER — BUPIVACAINE HCL 0.25 % IJ SOLN
2.0000 mL | INTRAMUSCULAR | Status: AC | PRN
  Administered 2024-05-06: 2 mL via INTRA_ARTICULAR

## 2024-05-06 NOTE — Progress Notes (Signed)
 Patient says that she had and ACL and MCL repair about 20 years ago for the left knee, and has not had any injury to that knee in the time since then. Over the last 7 months, she has had stiffness, swelling, and popping in the left knee. This stiffness is worse after prolonged time in the flexed position. She says that she does not have pain in the knee. She has a busy schedule, and does not have time to do anything on her own to treat her symptoms. She says that previously she did have fluid pulled from the knee.

## 2024-05-06 NOTE — Progress Notes (Addendum)
 "  Carla Alvarez - 54 y.o. female MRN [id number]  Date of birth: 1970-05-01  Office Visit Note: Visit Date: 05/06/2024 PCP: Iven Lang DASEN, PA-C Referred by: Iven Lang DASEN, PA-C  Subjective: Chief Complaint  Patient presents with   Left Knee - Pain   HPI: Carla Alvarez is a pleasant 54 y.o. female who presents today for chronic left knee pain with effusion.  Discussed the use of AI scribe software for clinical note transcription with the patient, who gave verbal consent to proceed.  History of Present Illness Carla Alvarez is a 54 year old female with left knee osteoarthritis and prior ACL reconstruction who presents with chronic left knee swelling and mechanical symptoms.  She had ACL and MCL reconstruction of the left knee about 20 years ago after an ice hockey injury with a pop and large effusion. She has not returned to sports since but needed the surgery for occupational stability.  For the past 7-8 months she has had recurrent left knee swelling with intermittent popping and clicking and reduced range of motion, especially difficulty with full flexion. She feels a palpable mass along the medial knee. She denies significant pain, instability, or giving way.  She previously had frequent aspirations, at times monthly, but stopped because of the burden. She has not tried a brace. Meloxicam  partially reduces inflammation but causes gastrointestinal discomfort. She is not using other oral or topical analgesics and is not in physical therapy or a home exercise program due to work demands.  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 Both knees get stiff after long drives, worse on the left. She denies recent trauma or falls and has difficulty attending rehabilitation visits because of her schedule.   Lab Results  Component Value Date   HGBA1C 5.9 (H) 04/01/2024    Pertinent ROS were reviewed with the patient and found to be negative unless  otherwise specified above in HPI.   Assessment & Plan: Visit Diagnoses:  1. Unilateral primary osteoarthritis, left knee   2. Chronic pain of left knee   3. Effusion, left knee    Assessment & Plan Left knee osteoarthritis with effusion Mild tricompartmental OA with Moderate osteoarthritis in the lateral compartment with effusion. Imaging shows valgus alignment and osteophyte formation. Symptoms persist due to occupational demands and limited rehabilitation. - Performed ultrasound-guided aspiration and injected anti-inflammatory medication. -Recommended ice 15-20 minutes of the next few days for postinjection pain and to help prevent reaccumulation of swelling -She will continue her meloxicam  15 mg daily with food for the next few days, given some of the GI upset, I would like her to plan to discontinue Meloxicam  then over the next 2-4 days as her pain and swelling likely significantly improves from the therapeutic aspiration and injection - Provided examples of compressive knee sleeves for support and effusion reduction --> BodyHelix, CopperFit - Advised knee sleeve use during work, removal at home. Tylenol PRN. - Provided guidance on expected improvement in motion and swelling. - Reviewed physical therapy benefits but noted work schedule limits participation --> Consider formal PT/HEP if issues going forward  History of ACL reconstruction, left knee Increased risk for osteoarthritis due to prior surgery. - Reviewed imaging showing intact ACL graft and arthritic changes. - Reassured stability of ACL reconstruction. - Educated on long-term risk of post-traumatic osteoarthritis. - Consider formal PT/HEP if issues going forward   Follow-up: Return if symptoms worsen or fail to improve.   Meds & Orders: No orders of  the defined types were placed in this encounter.   Orders Placed This Encounter  Procedures   Large Joint Inj   US  Guided Needle Placement - No Linked Charges      Procedures: Large Joint Inj: L knee on 05/06/2024 8:52 AM Indications: pain and joint swelling Details: 18 G 1.5 in needle, ultrasound-guided superolateral approach Medications: 4 mL lidocaine  1 %; 2 mL bupivacaine  0.25 %; 80 mg methylPREDNISolone  acetate 40 MG/ML Aspirate: 14 mL clear, yellow and blood-tinged Outcome: tolerated well, no immediate complications  US -guided Knee Aspiration, Left: After discussion on risks/benefits/indications was provided, informed verbal consent was obtained and a timeout was performed, patient was lying supine on exam table with knee bolster pillow in place. The knee was prepped with Chloraprep and multiple alcohol swabs. Utilizing superolateral approach, approximately 5 mL of lidocaine  1% was used for local anesthesia. Then using ultrasound guidance via a transverse-axis and in-plane approach, an 18g, 1.5 needle was inserted and 14 mL of clear straw-colored fluid was aspirated from the knee joint. Utilizing the same portal and a sterile syringe swap, the knee joint was then  injected under ultrasound guidance with 2:2 bupivicaine:depomedrol with flow of the injectate visualized going into the knee joint.  Patient tolerated procedure well without immediate complications.  Procedure, treatment alternatives, risks and benefits explained, specific risks discussed. Consent was given by the patient. Immediately prior to procedure a time out was called to verify the correct patient, procedure, equipment, support staff and site/side marked as required. Patient was prepped and draped in the usual sterile fashion.          Clinical History: No specialty comments available.  She reports that she has never smoked. She has never been exposed to tobacco smoke. She has never used smokeless tobacco.  Recent Labs    04/01/24 0921  HGBA1C 5.9*    Objective:   Vital Signs: LMP 04/26/2015   Physical Exam  Gen: Well-appearing, in no acute distress; non-toxic CV:  Well-perfused. Warm.  Resp: Breathing unlabored on room air; no wheezing. Psych: Fluid speech in conversation; appropriate affect; normal thought process  *MSK/Ortho Exam: Physical Exam MUSCULOSKELETAL: Knee: ACL stable, valgus deformity present, effusion present, no pain on extension or flexion.  *Left knee: Mild to moderate effusion present.  No significant joint line tenderness.  There is a degree of valgus deformity with functional tibial subluxation.  Intact Lachman and anterior/posterior drawer.  Imaging:  *2 view x-ray of the left knee including AP and lateral film from 04/01/24 was independently reviewed and interpreted by myself today during the office visit.  X-rays demonstrate mild tricompartmental arthritic change which is at least moderate over the lateral medial tibiofemoral joint space.  There is a moderate-sized spur off the lateral femoral condyle and bony sclerosis of the lateral joint line.  There is early varus collapse with a degree of functional tibial subluxation.  Anchor with previous ACL reconstruction changes noted.  Neutral patella.  DG Knee 1-2 Views Left EXAM: 1 OR 2 VIEW(S) XRAY OF THE LEFT KNEE 04/01/2024 09:43:00 AM  COMPARISON: None available.  CLINICAL HISTORY: assess for arthritis  FINDINGS:  BONES AND JOINTS: No acute fracture. No malalignment. No significant joint effusion. ACL reconstruction changes noted. Prominent marginal osteophytes of the lateral compartment with mild lateral compartment joint space narrowing. There is chronic irregularity along the lateral aspect of the tibial plateau. Mild patellofemoral osteophytosis. Mild marginal osteophytosis of the medial compartment.  SOFT TISSUES: The soft tissues are unremarkable.  IMPRESSION: 1. Prominent  marginal osteophytes of the lateral compartment with mild joint space narrowing and chronic irregularity along the lateral tibial plateau. 2. Mild patellofemoral and medial compartment  osteoarthritis. 3. ACL reconstruction changes noted.  Electronically signed by: Donnice Mania MD 04/04/2024 11:45 PM EST RP Workstation: [id number]  Past Medical/Family/Surgical/Social History: Medications & Allergies reviewed per EMR, new medications updated. Patient Active Problem List   Diagnosis Date Noted   Other fatigue 09/04/2023   S/P thyroidectomy 09/04/2023   Iron deficiency 09/04/2023   Primary insomnia 02/20/2023   History of hysteroscopy 05/03/2021   History of primary hyperparathyroidism 05/18/2020   Symptomatic menopausal or female climacteric states 01/21/2019   Vitamin B12 deficiency 01/09/2019   Chronic rhinitis 01/07/2019   History of iron deficiency 01/07/2019   Postoperative hypothyroidism 10/07/2016   Thyroid  cancer (HCC) 08/01/2016   Prediabetes 02/29/2016   Mixed hyperlipidemia 01/18/2016   Past Medical History:  Diagnosis Date   Genital herpes    Hyperlipidemia    Pre-diabetes    Thyroid  cyst    Family History  Adopted: Yes   Past Surgical History:  Procedure Laterality Date   ANTERIOR CRUCIATE LIGAMENT REPAIR Left    COLONOSCOPY  2022   Dr Towana Flint   COLONOSCOPY  02/12/2024   MEDIAL COLLATERAL LIGAMENT REPAIR, KNEE Left    THYROIDECTOMY  2018   Social History   Occupational History   Not on file  Tobacco Use   Smoking status: Never    Passive exposure: Never   Smokeless tobacco: Never  Vaping Use   Vaping status: Never Used  Substance and Sexual Activity   Alcohol use: Not Currently    Comment: 2 beers at night when at home   Drug use: Not Currently    Comment: CBD gummies in past   Sexual activity: Yes    Birth control/protection: Post-menopausal   "

## 2024-05-13 ENCOUNTER — Encounter (HOSPITAL_BASED_OUTPATIENT_CLINIC_OR_DEPARTMENT_OTHER): Payer: Self-pay | Admitting: Student

## 2024-05-13 ENCOUNTER — Ambulatory Visit (INDEPENDENT_AMBULATORY_CARE_PROVIDER_SITE_OTHER): Admitting: Student

## 2024-05-13 VITALS — BP 117/79 | HR 73 | Temp 98.0°F | Resp 16 | Ht 63.0 in | Wt 160.0 lb

## 2024-05-13 DIAGNOSIS — Z0000 Encounter for general adult medical examination without abnormal findings: Secondary | ICD-10-CM

## 2024-05-13 DIAGNOSIS — Z23 Encounter for immunization: Secondary | ICD-10-CM | POA: Diagnosis not present

## 2024-05-13 NOTE — Progress Notes (Signed)
 "  Complete physical exam  Patient: Carla Alvarez   DOB: Sep 05, 1970   54 y.o. Female  MRN: [id number]  Subjective:    Chief Complaint  Patient presents with   Annual Exam    Annual exam. Will have breast exam & pap with [facility] [place] at the end of the month.    Discussed the use of AI scribe software for clinical note transcription with the patient, who gave verbal consent to proceed.  History of Present Illness   Carla Alvarez is a 54 year old female who presents for an annual physical exam.  She experiences discomfort similar to menstrual cramps and occasional bleeding on toilet paper when urinating. She recalls a previous incident where a polyp was found and removed after similar symptoms.  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 Breakfast typically includes oatmeal or grits, and she sometimes consumes Lunchables or similar snacks during the day. For lunch, she drinks Boost or Ensure and takes black cohosh and fish oil supplements.  Her physical activity is primarily related to her work, which involves strenuous tasks such as pulling tarps and walking around stores. She describes her work as physically demanding, involving lifting heavy tarps and securing loads.  She is a poor sleeper and has been trying magnesium oil and sleep therapy lotion to improve her sleep quality. She reports feeling fatigued but attributes it to her usual sleep patterns.      Most recent fall risk assessment:     No data to display           Most recent depression screenings:    04/01/2024    8:25 AM 09/04/2023   10:51 AM  PHQ 2/9 Scores  PHQ - 2 Score 3 1  PHQ- 9 Score 9 7      Data saved with a previous flowsheet row definition    Patient Active Problem List   Diagnosis Date Noted   Other fatigue 09/04/2023   S/P thyroidectomy 09/04/2023   Iron deficiency 09/04/2023    Primary insomnia 02/20/2023   History of hysteroscopy 05/03/2021   History of primary hyperparathyroidism 05/18/2020   Symptomatic menopausal or female climacteric states 01/21/2019   Vitamin B12 deficiency 01/09/2019   Chronic rhinitis 01/07/2019   History of iron deficiency 01/07/2019   Postoperative hypothyroidism 10/07/2016   Thyroid  cancer (HCC) 08/01/2016   Prediabetes 02/29/2016   Mixed hyperlipidemia 01/18/2016   Past Medical History:  Diagnosis Date   Genital herpes    Hyperlipidemia    Pre-diabetes    Thyroid  cyst    Social History[1] Allergies[2]    Patient Care Team: Draysen Weygandt T, PA-C as PCP - General (Physician Assistant)   Show/hide medication list[3]  ROS  Per HPI     Objective:     BP 117/79   Pulse 73   Temp 98 F (36.7 C) (Oral)   Resp 16   Ht 5' 3 (1.6 m)   Wt 160 lb (72.6 kg)   LMP 04/26/2015   SpO2 97%   BMI 28.34 kg/m  BP Readings from Last 3 Encounters:  05/13/24 117/79  04/01/24 112/77  02/12/24 127/82   Wt Readings from Last 3 Encounters:  05/13/24 160 lb (72.6 kg)  04/01/24 163 lb (73.9 kg)  02/12/24 160 lb (72.6 kg)   SpO2 Readings from Last 3 Encounters:  05/13/24 97%  04/01/24 98%  02/12/24 97%  Physical Exam Constitutional:      General: She is not in acute distress.    Appearance: Normal appearance. She is not ill-appearing or diaphoretic.  HENT:     Head: Normocephalic and atraumatic.     Right Ear: Tympanic membrane, ear canal and external ear normal.     Left Ear: Tympanic membrane, ear canal and external ear normal.     Nose: Nose normal.     Mouth/Throat:     Mouth: Mucous membranes are moist.     Pharynx: Oropharynx is clear.  Eyes:     General: No scleral icterus.       Right eye: No discharge.        Left eye: No discharge.     Extraocular Movements: Extraocular movements intact.     Conjunctiva/sclera: Conjunctivae normal.     Pupils: Pupils are equal, round, and reactive to light.   Neck:     Thyroid : No thyroid  mass, thyromegaly or thyroid  tenderness.     Vascular: No carotid bruit.  Cardiovascular:     Rate and Rhythm: Normal rate and regular rhythm.     Pulses: Normal pulses.     Heart sounds: Normal heart sounds. No murmur heard.    No friction rub. No gallop.  Pulmonary:     Effort: Pulmonary effort is normal.     Breath sounds: Normal breath sounds. No wheezing, rhonchi or rales.  Chest:     Chest wall: No tenderness.  Abdominal:     General: Bowel sounds are normal. There is no distension.     Palpations: Abdomen is soft.     Tenderness: There is no abdominal tenderness. There is no guarding.  Musculoskeletal:        General: No swelling, deformity or signs of injury.     Cervical back: Neck supple.     Right lower leg: No edema.     Left lower leg: No edema.  Lymphadenopathy:     Cervical: No cervical adenopathy.     Right cervical: No superficial or posterior cervical adenopathy.    Left cervical: No superficial cervical adenopathy.  Skin:    Coloration: Skin is not jaundiced.     Findings: No rash.  Neurological:     General: No focal deficit present.     Mental Status: She is alert and oriented to person, place, and time.     Motor: No weakness.     Deep Tendon Reflexes: Reflexes normal.  Psychiatric:        Behavior: Behavior normal.      No results found for any visits on 05/13/24. Last CBC Lab Results  Component Value Date   WBC 4.5 04/01/2024   HGB 13.5 04/01/2024   HCT 40.6 04/01/2024   MCV 91 04/01/2024   MCH 30.2 04/01/2024   RDW 13.0 04/01/2024   PLT 274 04/01/2024   Last metabolic panel Lab Results  Component Value Date   GLUCOSE 122 (H) 04/01/2024   NA 137 04/01/2024   K 4.3 04/01/2024   CL 100 04/01/2024   CO2 25 04/01/2024   BUN 11 04/01/2024   CREATININE 0.86 04/01/2024   EGFR 80 04/01/2024   CALCIUM  9.2 04/01/2024   PROT 5.9 (L) 04/01/2024   ALBUMIN 4.2 04/01/2024   LABGLOB 1.7 04/01/2024   BILITOT <0.2  04/01/2024   ALKPHOS 30 (L) 04/01/2024   AST 14 04/01/2024   ALT 22 04/01/2024   Last lipids Lab Results  Component Value Date   CHOL  173 04/01/2024   HDL 47 04/01/2024   LDLCALC 95 04/01/2024   TRIG 181 (H) 04/01/2024   CHOLHDL 3.7 04/01/2024   Last hemoglobin A1c Lab Results  Component Value Date   HGBA1C 5.9 (H) 04/01/2024        Assessment & Plan:    Routine Health Maintenance and Physical Exam  Health Maintenance  Topic Date Due   Hepatitis C Screening  Never done   Hepatitis B Vaccine (1 of 3 - 19+ 3-dose series) Never done   Pap with HPV screening  Never done   COVID-19 Vaccine (1) 05/29/2024*   Flu Shot  07/23/2024*   Breast Cancer Screening  07/24/2025   Colon Cancer Screening  02/11/2029   DTaP/Tdap/Td vaccine (2 - Td or Tdap) 02/15/2032   Pneumococcal Vaccine for age over 39  Completed   HPV Vaccine (No Doses Required) Completed   HIV Screening  Completed   Zoster (Shingles) Vaccine  Completed   Meningitis B Vaccine  Aged Out  *Topic was postponed. The date shown is not the original due date.    Assessment and Plan    General adult medical examination Routine physical examination with no acute concerns. Discussed dietary habits, emphasizing balanced nutrition and reducing sugar intake. Encouraged high-protein snacks and mindful carbohydrate consumption. Reviewed physical activity, noting regular movement and physical exertion related to work. Addressed recent discomfort and bleeding, with follow-up with OB GYN scheduled. - Continue current dietary modifications, focusing on reducing sugar intake and increasing protein. - Encouraged high-protein snacks such as Bearbells protein bars and Fairlife protein shakes. - Advised to follow up with OB GYN regarding recent discomfort and bleeding. - Scheduled next physical examination in one year.  Pneumococcal vaccination Discussed pneumococcal vaccination, recommended for individuals over 50 to reduce pneumonia  risk. Explained vaccine benefits and potential side effects, including soreness and fatigue. She agreed to receive the vaccine. - Administered pneumococcal vaccine today. - Monitor for potential side effects such as soreness and fatigue.      Return in about 1 year (around 05/13/2025) for Annual Physical.     Lang DASEN Crayton Savarese, PA-C     [1]  Social History Tobacco Use   Smoking status: Never    Passive exposure: Never   Smokeless tobacco: Never  Vaping Use   Vaping status: Never Used  Substance Use Topics   Alcohol use: Yes    Comment: 2 beers at night when at home   Drug use: Not Currently    Comment: CBD gummies in past  [2]  Allergies Allergen Reactions   Bee Venom Anaphylaxis   Wasp Venom Anaphylaxis  [3]  Outpatient Medications Prior to Visit  Medication Sig   atorvastatin  (LIPITOR) 20 MG tablet Take 1 tablet (20 mg total) by mouth daily.   Coenzyme Q10 (COQ-10) 100 MG CAPS Take 100 mg by mouth daily.   estradiol  (ESTRACE ) 0.1 MG/GM vaginal cream Apply 0.5 to 1 gram directly to external genitalia once weekly until the tube is finished.   estradiol  (ESTRACE ) 1 MG tablet Take 1 mg by mouth daily.   fexofenadine (ALLEGRA) 180 MG tablet Take 180 mg by mouth daily.   Omega-3 Fatty Acids (KP FISH OIL) 1200 MG CAPS Take 1,200 mg by mouth daily.   progesterone  (PROMETRIUM ) 100 MG capsule Take 1 capsule (100 mg total) by mouth at bedtime.   valACYclovir (VALTREX) 1000 MG tablet Take 1,000 mg by mouth as needed.   zolpidem (AMBIEN) 10 MG tablet Take 10 mg by mouth  at bedtime as needed.   [DISCONTINUED] meloxicam  (MOBIC ) 15 MG tablet Take 1 tablet (15 mg total) by mouth daily.   cyanocobalamin (VITAMIN B12) 1000 MCG tablet Take 1 tablet by mouth daily.   levothyroxine (SYNTHROID) 112 MCG tablet Take 112 mcg by mouth daily.   vitamin E 180 MG (400 UNITS) capsule Take 400 Units by mouth daily.   No facility-administered medications prior to visit.   "

## 2024-05-13 NOTE — Patient Instructions (Addendum)
 It was nice to see you today!  As we discussed in clinic: - Let us  know if the dosages on your medications do not look correct.  Things to do to keep yourself healthy! - Exercise at least 30-45 minutes a day, 3-4 days a week.  - Eat a low-fat diet with lots of fruits and vegetables, up to 7-9 servings per day.  - Seatbelts can save your life. Wear them always.  - Smoke detectors on every level of your home, check batteries every year.  - Eye Doctor: have an eye exam every 1-2 years, even if you do not wear glasses or contacts. - Safe sex: if you may be exposed to STDs, use a condom.  - Alcohol: If you drink, do it moderately, less than 2 drinks per day.  - Health Care Power of Attorney: choose someone to speak for you if you are not able.  - Depression and anxiety are common in our stressful world.If you're feeling stressed, down, or like you're losing interest in things you normally enjoy, please come in for a visit.  - Violence: If anyone is threatening or hurting you, please call immediately.  Everyone deserves to be safe and loved in all of the relationships.   If you have any problems before your next visit feel free to message me via MyChart (minor issues or questions) or call the office, otherwise you may reach out to schedule an office visit.  Thank you! Varie Machamer, PA-C

## 2025-05-19 ENCOUNTER — Encounter (HOSPITAL_BASED_OUTPATIENT_CLINIC_OR_DEPARTMENT_OTHER): Admitting: Student
